# Patient Record
Sex: Female | Born: 1995 | Race: Black or African American | Hispanic: No | Marital: Single | Smoking: Never smoker
Health system: Southern US, Community
[De-identification: ages and names within clinical notes are randomized; demographics above are authoritative.]

## PROBLEM LIST (undated history)

## (undated) DIAGNOSIS — J302 Other seasonal allergic rhinitis: Secondary | ICD-10-CM

## (undated) HISTORY — PX: NO PAST SURGERIES: SHX2092

---
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
## 2017-01-11 ENCOUNTER — Emergency Department (HOSPITAL_COMMUNITY)
Admission: EM | Admit: 2017-01-11 | Discharge: 2017-01-11 | Disposition: A | Discharge status: Home / Self Care | Payer: Medicaid Other | Attending: Emergency Medicine | Admitting: Emergency Medicine

## 2017-01-11 DIAGNOSIS — H6123 Impacted cerumen, bilateral: Secondary | ICD-10-CM

## 2017-01-11 DIAGNOSIS — H9201 Otalgia, right ear: Secondary | ICD-10-CM | POA: Diagnosis present

## 2017-01-11 DIAGNOSIS — H6501 Acute serous otitis media, right ear: Secondary | ICD-10-CM

## 2017-01-11 MED ORDER — IBUPROFEN 400 MG PO TABS
600.0000 mg | ORAL_TABLET | Freq: Once | ORAL | Status: AC
  Administered 2017-01-11: 600 mg via ORAL
  Filled 2017-01-11: qty 1

## 2017-01-11 MED ORDER — AMOXICILLIN-POT CLAVULANATE 875-125 MG PO TABS: 1.0000 | ORAL_TABLET | Freq: Two times a day (BID) | ORAL | 0 refills | Status: DC

## 2017-01-11 MED ORDER — CARBAMIDE PEROXIDE 6.5 % OT SOLN: 5.0000 [drp] | Freq: Two times a day (BID) | OTIC | 0 refills | Status: DC

## 2017-01-11 NOTE — Discharge Instructions (Signed)
Take augmentin twice daily for a week.   Use debrox twice daily.   You have right ear infection. See ENT or your doctor next week for repeat ear exam   Return to ER if you have worse ear pain, fever, purulent discharge from the ear.

## 2017-01-11 NOTE — ED Provider Notes (Signed)
[facility] Provider Note   CSN: [id number] Arrival date & time: 01/11/17  2055     History   Chief Complaint Chief Complaint  Patient presents with  . Otalgia    HPI Angela Esparza is a 21 y.o. female history of seasonal allergies, previous ear infection who presents with bilateral ear congestion and right ear pain. She states that for the last several days, she felt that both her ears were "clogged". Her right ear started hurting today. She states that she has previous ear infection many years ago. Denies drainage from the ear, denies fevers.   The history is provided by the patient.    Past Medical History:  Diagnosis Date  . Seasonal allergies     There are no active problems to display for this patient.   Past Surgical History:  Procedure Laterality Date  . NO PAST SURGERIES      OB History    No data available       Home Medications    Prior to Admission medications   Medication Sig Start Date End Date Taking? Authorizing Provider  cetirizine (ZYRTEC) 10 MG tablet Take 10 mg by mouth daily.    [provider]  phenazopyridine (PYRIDIUM) 200 MG tablet Take 1 tablet (200 mg total) by mouth 3 (three) times daily. 05/08/15   Judeth Horn, NP    Family History No family history on file.  Social History Social History  Substance Use Topics  . Smoking status: Never Smoker  . Smokeless tobacco: Never Used  . Alcohol use No     Allergies   Patient has no known allergies.   Review of Systems Review of Systems  HENT: Positive for ear pain.   All other systems reviewed and are negative.    Physical Exam Updated Vital Signs BP 138/81 (BP Location: Right Arm)   Pulse 94   Temp 98.7 F (37.1 C) (Oral)   Resp 16   Ht 5' 1.75" (1.568 m)   Wt 57.6 kg (127 lb)   LMP 12/21/2016 (Approximate)   SpO2 100%   BMI 23.42 kg/m   Physical Exam  Constitutional: She is oriented to person, place, and time. She appears  well-developed.  HENT:  Head: Normocephalic.  Mouth/Throat: Oropharynx is clear and moist.  Bilateral cerumen impaction   Eyes: Conjunctivae and EOM are normal. Pupils are equal, round, and reactive to light.  Neck: Normal range of motion. Neck supple.  Cardiovascular: Normal rate, regular rhythm and normal heart sounds.   Pulmonary/Chest: Effort normal and breath sounds normal.  Abdominal: Soft. Bowel sounds are normal.  Musculoskeletal: Normal range of motion.  Neurological: She is alert and oriented to person, place, and time. No cranial nerve deficit. Coordination normal.  Skin: Skin is warm.  Psychiatric: She has a normal mood and affect.  Nursing note and vitals reviewed.    ED Treatments / Results  Labs (all labs ordered are listed, but only abnormal results are displayed) Labs Reviewed - No data to display  EKG  EKG Interpretation None       Radiology No results found.  Procedures Procedures (including critical care time)  Ceruminosis is noted.  Wax is removed by syringing and manual debridement with currette. Instructions for home care to prevent wax buildup are given.   Medications Ordered in ED Medications  ibuprofen (ADVIL,MOTRIN) tablet 600 mg (600 mg Oral Given 01/11/17 2119)     Initial Impression / Assessment and Plan / ED Course  I have reviewed  the triage vital signs and the nursing notes.  Pertinent labs & imaging results that were available during my care of the patient were reviewed by me and considered in my medical decision making (see chart for details).     Angela Esparza is a 21 y.o. female here with bilateral cerumen impaction. Unable to visualize TM bilaterally, no signs of otitis externa. Will perform cerumen impaction.   10:02 PM I irrigated the ear and used curette as well. L TM nl. R TM with otitis media and TM retracted. I still have trouble seeing the whole TM and unsure if there is any perforation. Will give augmentin, try  debrox. Will have her follow up with ENT.   Final Clinical Impressions(s) / ED Diagnoses   Final diagnoses:  None    New Prescriptions New Prescriptions   No medications on file     Charlynne PanderYao, Devaeh Amadi Hsienta, MD 01/11/17 2204

## 2017-01-11 NOTE — ED Notes (Signed)
Patient denies pain and is resting comfortably.  

## 2017-01-11 NOTE — ED Triage Notes (Signed)
Reports pain in right ear that feels like an infection.  Also reports that both ears are "clogged".

## 2017-04-03 IMAGING — DX DG LUMBAR SPINE 2-3V
3 series · 3 of 3 positions shown · non-contrast
Comparison: None.

CLINICAL DATA: Lower right-sided back pain that radiates to the
right side of torso. Pain for 4 days. No injury.

EXAM:
LUMBAR SPINE - 2-3 VIEW

[l-spine ap]
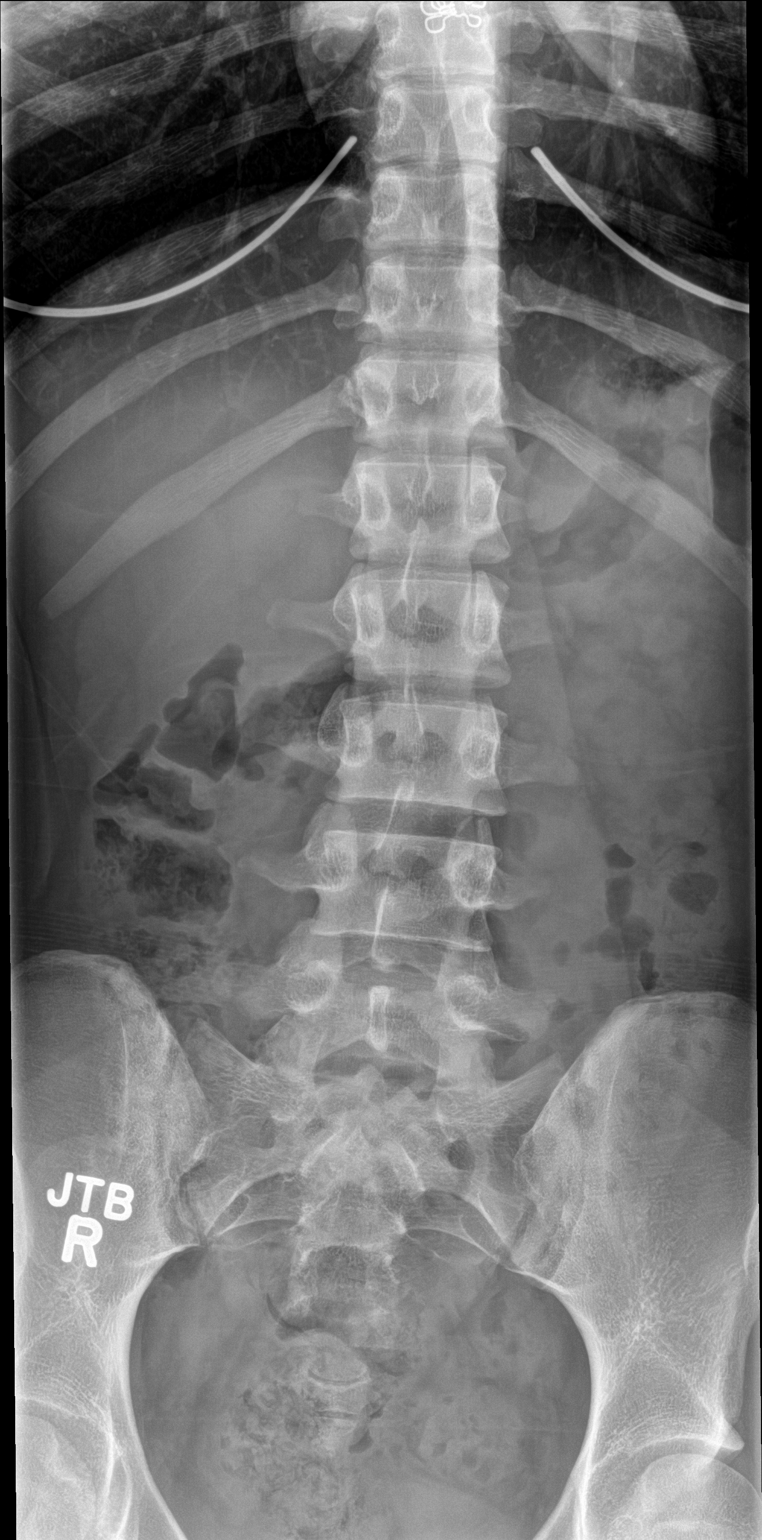

[l-spine lat]
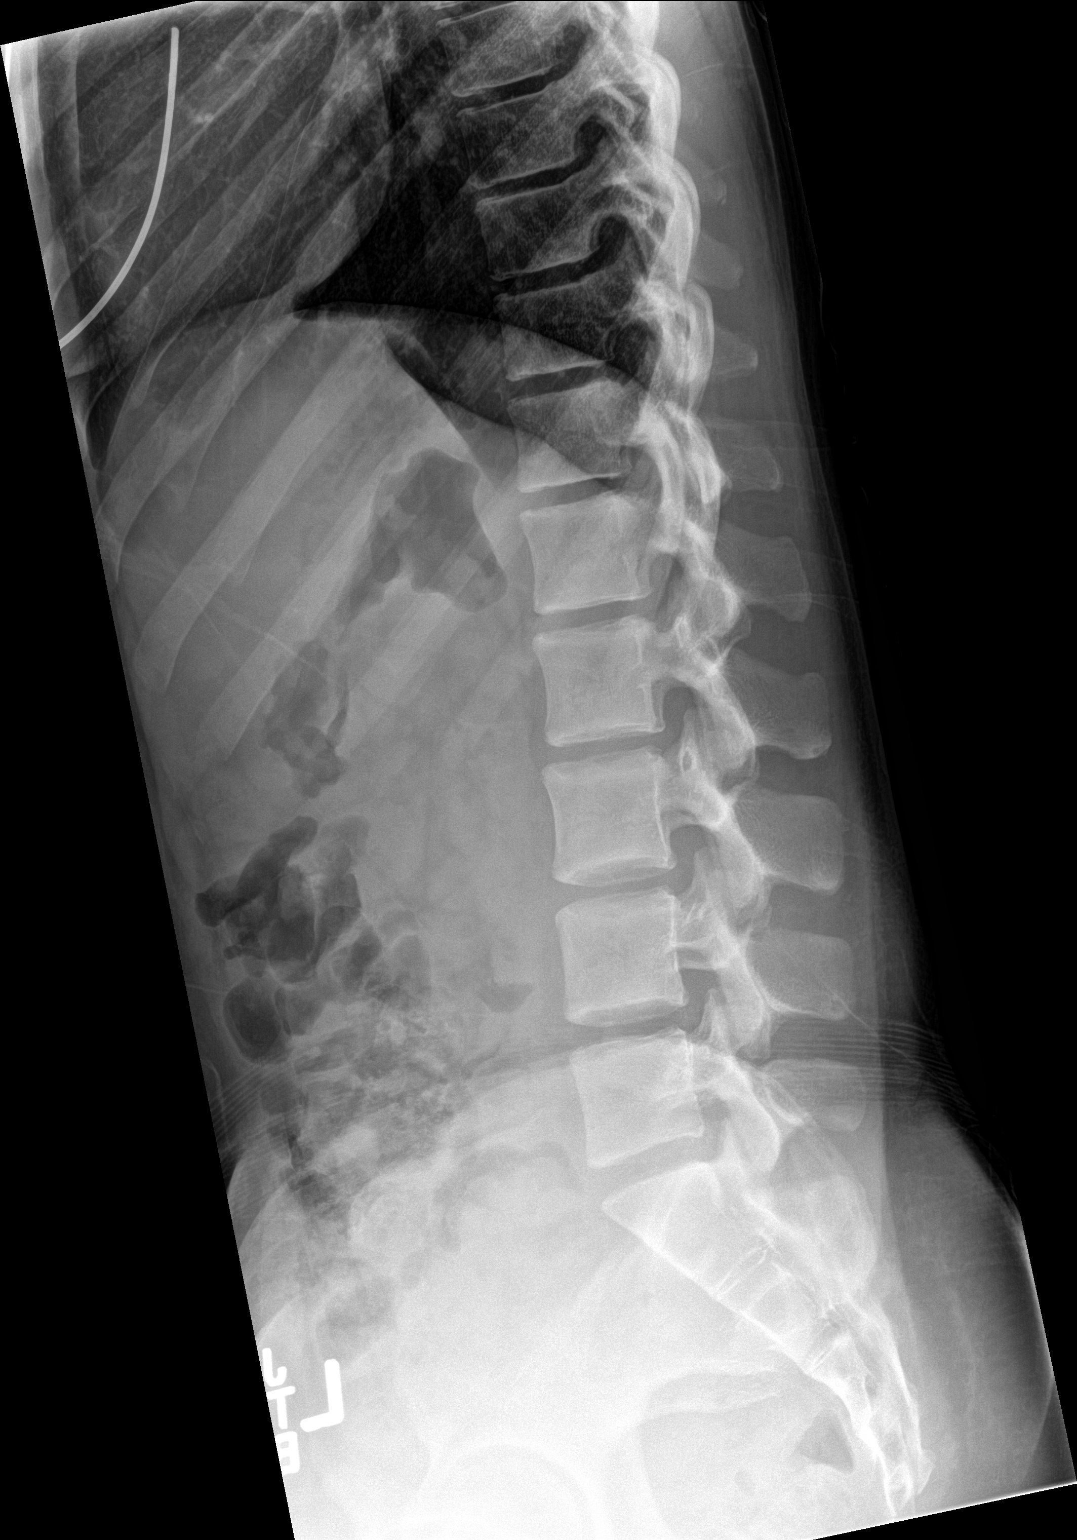

[l-spine spot]
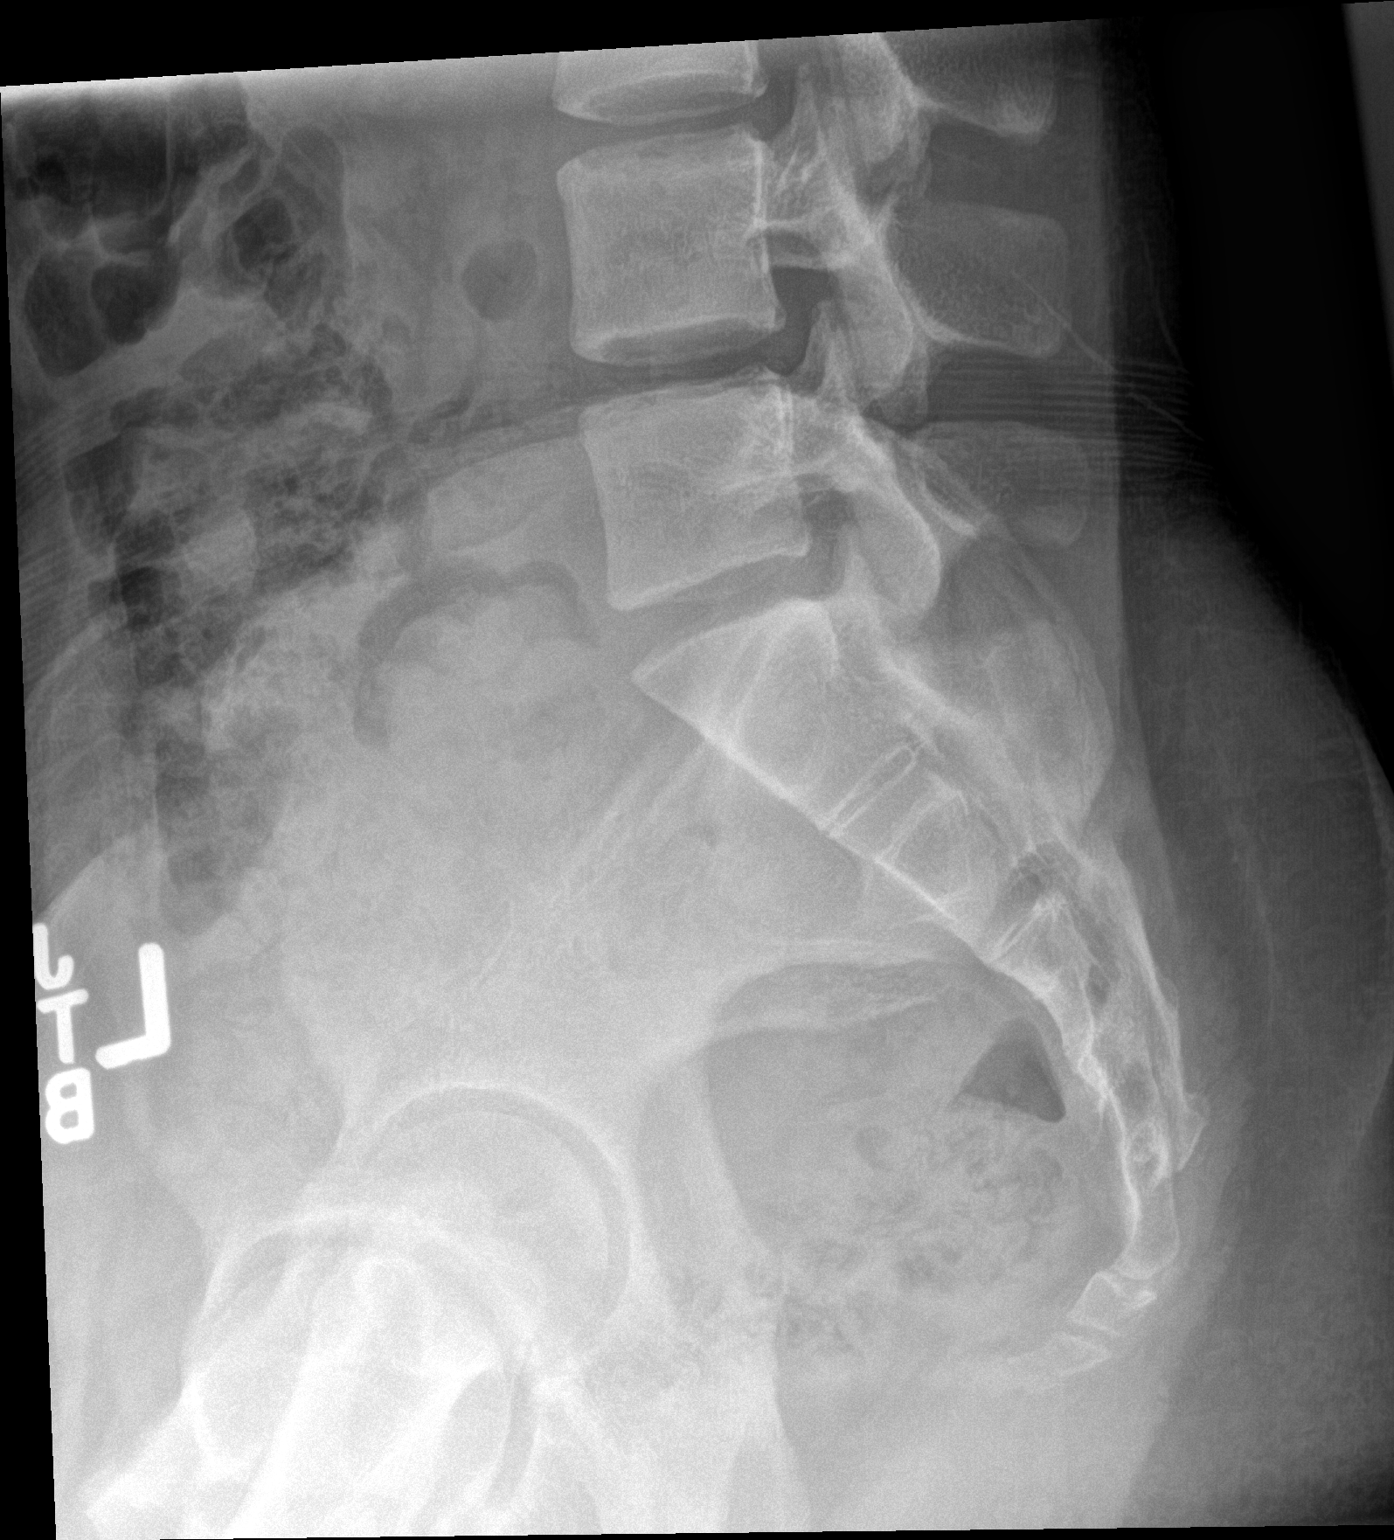

[3 of 3 positions shown; findings below may reference images not displayed]

FINDINGS: There is straightening of the normal lumbar lordosis. Alignment is
otherwise anatomic. Vertebral body and disc space height are
maintained.
IMPRESSION: Straightening of the normal cervical lordosis.  Otherwise negative.

## 2019-03-05 ENCOUNTER — Other Ambulatory Visit: Payer: Self-pay

## 2019-03-05 ENCOUNTER — Ambulatory Visit
Admission: EM | Admit: 2019-03-05 | Discharge: 2019-03-05 | Disposition: A | Discharge status: Home / Self Care | Payer: Self-pay

## 2019-03-05 ENCOUNTER — Encounter: Payer: Self-pay | Admitting: Emergency Medicine

## 2019-03-05 DIAGNOSIS — N898 Other specified noninflammatory disorders of vagina: Secondary | ICD-10-CM | POA: Insufficient documentation

## 2019-03-05 DIAGNOSIS — N3001 Acute cystitis with hematuria: Secondary | ICD-10-CM | POA: Insufficient documentation

## 2019-03-05 DIAGNOSIS — H6123 Impacted cerumen, bilateral: Secondary | ICD-10-CM | POA: Insufficient documentation

## 2019-03-05 DIAGNOSIS — B9689 Other specified bacterial agents as the cause of diseases classified elsewhere: Secondary | ICD-10-CM

## 2019-03-05 LAB — POCT URINALYSIS DIP (MANUAL ENTRY)
Bilirubin, UA: NEGATIVE
Glucose, UA: NEGATIVE mg/dL
Ketones, POC UA: NEGATIVE mg/dL
Nitrite, UA: NEGATIVE
Protein Ur, POC: NEGATIVE mg/dL
Spec Grav, UA: >=1.03 — AB (ref 1.010–1.025)
Urobilinogen, UA: 0.2 E.U./dL
pH, UA: 6 (ref 5.0–8.0)

## 2019-03-05 LAB — POCT URINE PREGNANCY: Preg Test, Ur: NEGATIVE

## 2019-03-05 MED ORDER — CEPHALEXIN 500 MG PO CAPS: 500.0000 mg | ORAL_CAPSULE | Freq: Two times a day (BID) | ORAL | 0 refills | Status: AC

## 2019-03-05 NOTE — ED Triage Notes (Signed)
Patient presents to [facility] for assessment of vaginal discharge x 2 weeks, denies pain during intercourse.  Patient is sexually active, does use oral contraceptives for [employer], and has a hx of BV.  Also c/o urinary frequency, burning with urination and a malodor starting 2 days ago.  Denies lower abdominal pain or back pain.

## 2019-03-05 NOTE — Discharge Instructions (Signed)
Take antibiotic as prescribed. We will call you with the results, and if positive treat appropriately. Recommend you use Debrox in your ears daily to soften cerumen and return for irrigation. ENT information also provided per your request.

## 2019-03-05 NOTE — ED Provider Notes (Signed)
[facility]    CSN: [id number] Arrival date & time: 03/05/19  1500     History   Chief Complaint Chief Complaint  Patient presents with  . Dysuria    HPI Angela Esparza is a 23 y.o. female with a remote history of BV presenting for 2-week course of vaginal discharge.  Patient states that her discharge does not seem quite like her previous episodes of BV as there is no malodor to it.  Patient denies vaginal, pelvic pain or irritation, dyspareunia, abdominal back pain.  Patient currently sexually active, not using protection.  LMP 8/1.  Patient also noting 2 days of urinary frequency, burning with urination, malodorous urine.  Patient has not anything for symptoms. Of note, patient wondering if her ears can get checked.  Patient states she has a chronic history of bilateral cerumen impaction: Has been to the ER for this previously, "they tried irrigating it but it was too hard so can come out ".  Patient has been using Debrox intermittently without significant relief.  Patient does not currently have insurance at this time, would like specialty evaluation, though does not want to have to pay the upfront cost.  Past Medical History:  Diagnosis Date  . Seasonal allergies     There are no active problems to display for this patient.   Past Surgical History:  Procedure Laterality Date  . NO PAST SURGERIES      OB History   No obstetric history on file.      Home Medications    Prior to Admission medications   Medication Sig Start Date End Date Taking? Authorizing Provider  tretinoin (RETIN-A) 0.025 % cream Apply topically at bedtime.   Yes [provider]  cephALEXin (KEFLEX) 500 MG capsule Take 1 capsule (500 mg total) by mouth 2 (two) times daily for 3 days. 03/05/19 03/08/19  Hall-Potvin, [place], PA-C  cetirizine (ZYRTEC) 10 MG tablet Take 10 mg by mouth daily.    [provider]    Family History History reviewed. No pertinent family  history.  Social History Social History   Tobacco Use  . Smoking status: Never Smoker  . Smokeless tobacco: Never Used  Substance Use Topics  . Alcohol use: No  . Drug use: Yes    Types: Marijuana    Comment: Last use of marijuana was June 2016     Allergies   Patient has no known allergies.   Review of Systems Review of Systems  Constitutional: Negative for fatigue and fever.  HENT: Negative for ear discharge, ear pain and hearing loss.   Respiratory: Negative for cough and shortness of breath.   Cardiovascular: Negative for chest pain and palpitations.  Gastrointestinal: Negative for constipation and diarrhea.  Genitourinary: Positive for dysuria, frequency, urgency and vaginal discharge. Negative for flank pain, hematuria, pelvic pain, vaginal bleeding and vaginal pain.     Physical Exam Triage Vital Signs ED Triage Vitals  Enc Vitals Group     BP 03/05/19 1517 130/90     Pulse Rate 03/05/19 1517 76     Resp 03/05/19 1517 18     Temp 03/05/19 1517 99.2 F (37.3 C)     Temp Source 03/05/19 1517 Oral     SpO2 03/05/19 1517 99 %     Weight --      Height --      Head Circumference --      Peak Flow --      Pain Score 03/05/19 1515 1  Pain Loc --      Pain Edu? --      Excl. in GC? --    No data found.  Updated Vital Signs BP 130/90 (BP Location: Left Arm)   Pulse 76   Temp 99.2 F (37.3 C) (Oral)   Resp 18   LMP 02/23/2019   SpO2 99%   Visual Acuity Right Eye Distance:   Left Eye Distance:   Bilateral Distance:    Right Eye Near:   Left Eye Near:    Bilateral Near:     Physical Exam Constitutional:      General: She is not in acute distress. HENT:     Head: Normocephalic and atraumatic.     Right Ear: External ear normal. There is impacted cerumen.     Left Ear: External ear normal. There is impacted cerumen.  Eyes:     General: No scleral icterus.    Pupils: Pupils are equal, round, and reactive to light.  Cardiovascular:     Rate  and Rhythm: Normal rate.  Pulmonary:     Effort: Pulmonary effort is normal.  Abdominal:     General: Bowel sounds are normal.     Palpations: Abdomen is soft.     Tenderness: There is no abdominal tenderness. There is no right CVA tenderness, left CVA tenderness or guarding.  Genitourinary:    Comments: Patient declined, self-swab performed Skin:    Coloration: Skin is not jaundiced or pale.  Neurological:     Mental Status: She is alert and oriented to person, place, and time.      UC Treatments / Results  Labs (all labs ordered are listed, but only abnormal results are displayed) Labs Reviewed  POCT URINALYSIS DIP (MANUAL ENTRY) - Abnormal; Notable for the following components:      Result Value   Spec Grav, UA >=1.030 (*)    Blood, UA trace-lysed (*)    Leukocytes, UA Small (1+) (*)    All other components within normal limits  POCT URINE PREGNANCY - Normal  URINE CULTURE  CERVICOVAGINAL ANCILLARY ONLY    EKG   Radiology No results found.  Procedures Procedures (including critical care time)  Medications Ordered in UC Medications - No data to display  Initial Impression / Assessment and Plan / UC Course  I have reviewed the triage vital signs and the nursing notes.  Pertinent labs & imaging results that were available during my care of the patient were reviewed by me and considered in my medical decision making (see chart for details).     1.  Acute cystitis with hematuria In office urine pregnancy test and dipstick done, reviewed by me: Negative pregnancy test, small leukocytes with trace, lysed RBCs: We will treat with Keflex.  2.  Vaginal discharge STI panel pending, will treat if indicated.  Prioritizing treatment of infectious UTI over history of BV to avoid adverse reactions to multiple antibiotic use.  Patient is part of decision making regarding this, agreeable to plan.  3.  Bilateral impacted cerumen Patient declined cerumen disimpaction attempt  in office today via irrigation, curette.  Patient to trial Debrox at home to help soften cerumen and seek reevaluation here for irrigation at a later date.  ENT contact information provided should patient change her mind regarding specialty care.  Final Clinical Impressions(s) / UC Diagnoses   Final diagnoses:  Vaginal discharge  Acute cystitis with hematuria  Bilateral impacted cerumen     Discharge Instructions  Take antibiotic as prescribed. We will call you with the results, and if positive treat appropriately. Recommend you use Debrox in your ears daily to soften cerumen and return for irrigation. ENT information also provided per your request.    ED Prescriptions    Medication Sig Dispense Auth. Provider   cephALEXin (KEFLEX) 500 MG capsule Take 1 capsule (500 mg total) by mouth 2 (two) times daily for 3 days. 6 capsule Hall-Potvin, [place], PA-C     Controlled Substance Prescriptions [place] Controlled Substance Registry consulted? Not Applicable   Shea EvansHall-Potvin, Brittany, Cordelia Poche-C 03/05/19 2017

## 2019-03-08 ENCOUNTER — Telehealth (HOSPITAL_COMMUNITY): Payer: Self-pay | Admitting: Emergency Medicine

## 2019-03-08 LAB — CERVICOVAGINAL ANCILLARY ONLY
Bacterial vaginitis: NEGATIVE
Chlamydia: NEGATIVE
Neisseria Gonorrhea: NEGATIVE
Trichomonas: NEGATIVE

## 2019-03-08 LAB — URINE CULTURE: Culture: 80000 — AB

## 2019-03-08 NOTE — Telephone Encounter (Signed)
Treated with keflex. Swab negative. Attempted to reach patient. No answer at this time.

## 2019-04-18 ENCOUNTER — Encounter (HOSPITAL_COMMUNITY): Payer: Self-pay | Admitting: Emergency Medicine

## 2019-04-18 ENCOUNTER — Ambulatory Visit (HOSPITAL_COMMUNITY)
Admission: EM | Admit: 2019-04-18 | Discharge: 2019-04-18 | Disposition: A | Discharge status: Home / Self Care | Payer: Medicaid Other | Attending: Emergency Medicine | Admitting: Emergency Medicine

## 2019-04-18 ENCOUNTER — Other Ambulatory Visit: Payer: Self-pay

## 2019-04-18 DIAGNOSIS — N760 Acute vaginitis: Secondary | ICD-10-CM | POA: Insufficient documentation

## 2019-04-18 DIAGNOSIS — B9689 Other specified bacterial agents as the cause of diseases classified elsewhere: Secondary | ICD-10-CM | POA: Insufficient documentation

## 2019-04-18 MED ORDER — METRONIDAZOLE 500 MG PO TABS: 500.0000 mg | ORAL_TABLET | Freq: Two times a day (BID) | ORAL | 0 refills | Status: AC

## 2019-04-18 NOTE — ED Triage Notes (Signed)
Pt c/o vaginal discharge, odor, for the past two weeks. staets she has hx of BV and this feels like it.

## 2019-04-18 NOTE — ED Provider Notes (Signed)
HPI  SUBJECTIVE:  Angela Esparza is a 23 y.o. female who presents with 2 weeks of thick, dark white odorous vaginal discharge.  No vomiting, back, abdominal, pelvic pain.  No urinary complaints.  No genital rash, labial swelling, itching.  No recent antibiotics, perfumed soaps or body washes.  She is in a long-term monogamous relationship with a female who is asymptomatic.  STDs are not a concern today.  She states this feels like BV which she gets frequently.  No aggravating or alleviating factors.  She has not tried anything for this.  She has remote history of chlamydia, yeast infection, recurrent BV.  No history of gonorrhea, HIV, HSV, syphilis, trichomonas, diabetes.  LMP:  2 to 3 weeks ago. Is on OCP's. Denies the possibility of being pregnant.  PMD: Health department.    Past Medical History:  Diagnosis Date  . Seasonal allergies     Past Surgical History:  Procedure Laterality Date  . NO PAST SURGERIES      No family history on file.  Social History   Tobacco Use  . Smoking status: Never Smoker  . Smokeless tobacco: Never Used  Substance Use Topics  . Alcohol use: No  . Drug use: Yes    Types: Marijuana    Comment: Last use of marijuana was June 2016    No current facility-administered medications for this encounter.   Current Outpatient Medications:  .  PRESCRIPTION MEDICATION, Some kind of birth control, pt does not remember the name., Disp: , Rfl:  .  cetirizine (ZYRTEC) 10 MG tablet, Take 10 mg by mouth daily., Disp: , Rfl:  .  metroNIDAZOLE (FLAGYL) 500 MG tablet, Take 1 tablet (500 mg total) by mouth 2 (two) times daily for 7 days., Disp: 14 tablet, Rfl: 0 .  tretinoin (RETIN-A) 0.025 % cream, Apply topically at bedtime., Disp: , Rfl:   No Known Allergies   ROS  As noted in HPI.   Physical Exam  BP 133/80   Pulse 81   Temp 97.9 F (36.6 C)   Resp 18   SpO2 100%   Constitutional: Well developed, well nourished, no acute distress Eyes:  EOMI,  conjunctiva normal bilaterally HENT: Normocephalic, atraumatic,mucus membranes moist Respiratory: Normal inspiratory effort Cardiovascular: Normal rate GI: nondistended soft.  No suprapubic, flank tenderness. Back: No CVAT GU: Deferred skin: No rash, skin intact Musculoskeletal: no deformities Neurologic: Alert & oriented x 3, no focal neuro deficits Psychiatric: Speech and behavior appropriate   ED Course   Medications - No data to display  No orders of the defined types were placed in this encounter.   No results found for this or any previous visit (from the past 24 hour(s)). No results found.  ED Clinical Impression  1. BV (bacterial vaginosis)      ED Assessment/Plan  History consistent with recurrent BV.  Will send home with 1 week of Flagyl.  Providing OB/GYN list for for routine care and for long-term treatment of her recurrent BV.  Sent off gonorrhea, chlamydia, trichomonas, yeast in addition to BV.  Discussed labs, imaging, MDM, treatment plan, and plan for follow-up with patient.  patient agrees with plan.   Meds ordered this encounter  Medications  . metroNIDAZOLE (FLAGYL) 500 MG tablet    Sig: Take 1 tablet (500 mg total) by mouth 2 (two) times daily for 7 days.    Dispense:  14 tablet    Refill:  0    *This clinic note was created using Dragon dictation  software. Therefore, there may be occasional mistakes despite careful proofreading.   ?    Melynda Ripple, MD 04/18/19 [phone]

## 2019-04-18 NOTE — Discharge Instructions (Addendum)
[place] [facility] Providers    [facility]       Phone: [phone]  [facility]   Phone: [phone]  [facility]  Phone: [phone]  [facility]  Phone: [phone]  [facility]  Phone: [phone]  [facility]   Phone: [phone]  [facility]       Phone: [phone]  [facility]    Phone: [phone]   [facility]    Phone: [phone]  [facility]    Phone: [phone]  [facility]    Phone: [phone]  [facility]       Phone: [phone]   [facility]  Phone: [phone]  [facility]    Phone: [phone]  [facility]   Phone: [phone]  [facility]      Phone: [phone]  [facility]    Phone: [phone]   

## 2019-04-19 LAB — CERVICOVAGINAL ANCILLARY ONLY
Bacterial Vaginitis (gardnerella): POSITIVE — AB
Candida Glabrata: NEGATIVE
Candida Vaginitis: NEGATIVE
Molecular Disclaimer: NEGATIVE
Molecular Disclaimer: NEGATIVE
Molecular Disclaimer: NEGATIVE
Molecular Disclaimer: NORMAL
Trichomonas: NEGATIVE

## 2019-04-20 LAB — CERVICOVAGINAL ANCILLARY ONLY
Chlamydia: NEGATIVE
Neisseria Gonorrhea: NEGATIVE

## 2022-08-24 ENCOUNTER — Encounter: Payer: Self-pay | Admitting: *Deleted

## 2022-08-29 ENCOUNTER — Telehealth: Payer: Self-pay | Admitting: Family Medicine

## 2022-08-29 ENCOUNTER — Ambulatory Visit (INDEPENDENT_AMBULATORY_CARE_PROVIDER_SITE_OTHER): Payer: Commercial Managed Care - HMO | Admitting: Obstetrics and Gynecology

## 2022-08-29 ENCOUNTER — Other Ambulatory Visit (HOSPITAL_COMMUNITY)
Admission: RE | Admit: 2022-08-29 | Discharge: 2022-08-29 | Disposition: A | Discharge status: Home / Self Care | Payer: Commercial Managed Care - HMO | Source: Ambulatory Visit | Attending: Obstetrics and Gynecology | Admitting: Obstetrics and Gynecology

## 2022-08-29 ENCOUNTER — Encounter: Payer: Self-pay | Admitting: Obstetrics and Gynecology

## 2022-08-29 ENCOUNTER — Other Ambulatory Visit: Payer: Self-pay

## 2022-08-29 VITALS — BP 115/71 | HR 87 | Wt 153.1 lb

## 2022-08-29 DIAGNOSIS — Z113 Encounter for screening for infections with a predominantly sexual mode of transmission: Secondary | ICD-10-CM | POA: Diagnosis present

## 2022-08-29 DIAGNOSIS — R87612 Low grade squamous intraepithelial lesion on cytologic smear of cervix (LGSIL): Secondary | ICD-10-CM | POA: Insufficient documentation

## 2022-08-29 DIAGNOSIS — N879 Dysplasia of cervix uteri, unspecified: Secondary | ICD-10-CM | POA: Insufficient documentation

## 2022-08-29 HISTORY — PX: COLPOSCOPY: PRO47

## 2022-08-29 LAB — POCT PREGNANCY, URINE: Preg Test, Ur: NEGATIVE

## 2022-08-29 NOTE — Telephone Encounter (Signed)
Has questions about HPV, would like a call back

## 2022-08-29 NOTE — Progress Notes (Signed)
Obstetrics and Gynecology New Patient Evaluation  Appointment Date: 08/29/2022  OBGYN Clinic: [facility]  Referring Provider: [facility]  Chief Complaint: Pap smear consult   History of Present Illness: Angela Esparza' Angela Esparza is a 27 y.o. African-American G0P, seen for the above chief complaint. Her past medical history is significant for LSIL pap  Pap History 04/05/2022: ASCUS/HPV (untyped) LSIL/HPV+ (untyped) 01/14/2020: LSIL/no HPV done  Patient states these are her only pap smears and no biopsies in the past  Review of Systems: A comprehensive review of systems was negative.   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 She is currently using oral contraceptives (estrogen/progesterone) for contraception.   Social History:  Social History   Socioeconomic History   Marital status: Single    Spouse name: Not on file   Number of children: Not on file   Years of education: Not on file   Highest education level: Not on file  Occupational History   Not on file  Tobacco Use   Smoking status: Never   Smokeless tobacco: Never  Substance and Sexual Activity   Alcohol use: No   Drug use: Yes    Types: Marijuana    Comment: Last use of marijuana was June 2016   Sexual activity: Never  Other Topics Concern   Not on file  Social History Narrative   Not on file   Social Determinants of Health   Financial Resource Strain: Not on file  Food Insecurity: No Food Insecurity (08/29/2022)   Hunger Vital Sign    Worried  About Running Out of Food in the Last Year: Never true    Ran Out of Food in the Last Year: Never true  Transportation Needs: No Transportation Needs (08/29/2022)   PRAPARE - Hydrologist (Medical): No    Lack of Transportation (Non-Medical): No  Physical Activity: Not on file  Stress: Not on file  Social Connections: Not on file  Intimate Partner Violence: Not on file    Family History: No family history on file.   Medications Angela Esparza had no medications administered during this visit. Current Outpatient Medications  Medication Sig Dispense Refill   norgestimate-ethinyl estradiol (ORTHO-CYCLEN) 0.25-35 MG-MCG tablet Take 1 tablet by mouth daily.     No current facility-administered medications for this visit.    Allergies Patient has no known allergies.   Physical Exam:  BP 115/71   Pulse 87   Wt 153 lb 1.6 oz (69.4 kg)   BMI 28.23 kg/m  Body mass index is 28.23 kg/m. General appearance: Well nourished, well developed female in no acute distress.  Respiratory:  Normal respiratory effort Abdomen:diffusely non tender to palpation, non distended Neuro/Psych:  Normal mood and affect.  Skin:  Warm and dry.  Lymphatic:  No inguinal lymphadenopathy.   Pelvic exam: is not limited by body habitus EGBUS: within normal limits Vagina: within normal limits and with no blood or  discharge in the vault Cervix: normal appearing cervix without tenderness, discharge or lesions. 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 Will let her know results but told her she will likely just need repeat pap in one year (Feb 2025)  No orders of the defined types were placed in this encounter.   RTC PRN  Durene Romans MD Attending Center for [facility])

## 2022-08-29 NOTE — Procedures (Signed)
Colposcopy Procedure Note  Pre-operative Diagnosis:  Pap History 04/05/2022: ASCUS/HPV (untyped) LSIL/HPV+ (untyped) 01/14/2020: LSIL/no HPV done  Post-operative Diagnosis: CIN 1  Procedure Details  Urine pregnancy test: negative.   The risks (including infection, bleeding, pain) and benefits of the procedure were explained to the patient and written informed consent was obtained.  The patient was placed in the dorsal lithotomy position. A Pederson was speculum inserted in the vagina, and the cervix was visualized.  Acetic acid staining was done and the cervix was viewed with green filter  Biopsy from 6 o'clock and then single toothed tenaculum applied and endocervical curettage in all four quadrants done. There was no bleeding after procedure after application of monsel's  Findings: diffuse AWE changes. Some friability at 6 o'clock  Adequate: yes  Specimens: 6 o'clock  Condition: Stable  Complications: None  Plan: Patient also denies any tobacco use or 2nd hand smoke exposure.  The patient was advised to call for any fever or for prolonged or severe pain or bleeding. She was advised to use OTC analgesics as needed for mild to moderate pain. Pelvic rest for one week   Durene Romans MD Attending Center for [facility])

## 2022-08-30 LAB — CERVICOVAGINAL ANCILLARY ONLY
Bacterial Vaginitis (gardnerella): NEGATIVE
Candida Glabrata: NEGATIVE
Candida Vaginitis: NEGATIVE
Chlamydia: NEGATIVE
Comment: NEGATIVE
Comment: NEGATIVE
Comment: NEGATIVE
Comment: NEGATIVE
Comment: NEGATIVE
Comment: NORMAL
Neisseria Gonorrhea: NEGATIVE
Trichomonas: NEGATIVE

## 2022-08-30 LAB — SURGICAL PATHOLOGY

## 2022-08-30 NOTE — Telephone Encounter (Signed)
Pt called requesting clarification on why a colposcopy was performed at her appointment yesterday (02/05) with Dr. Ilda Basset. RN explained that patient had history of 3 abnormal PAP smears which resulted in next step to be colposcopy. Pt also requesting results from vaginal swab. Results given. I explained that Dr. Ilda Basset would make his comments on the colpo once it resulted. Pt verbalized understanding and denied further questions.

## 2022-09-12 ENCOUNTER — Encounter: Payer: Self-pay | Admitting: Obstetrics and Gynecology

## 2023-12-22 ENCOUNTER — Ambulatory Visit: Payer: Self-pay

## 2024-02-01 ENCOUNTER — Inpatient Hospital Stay
Admission: RE | Admit: 2024-02-01 | Discharge: 2024-02-01 | Disposition: A | Discharge status: Home / Self Care | Payer: Self-pay | Source: Ambulatory Visit

## 2024-03-07 ENCOUNTER — Encounter: Payer: Self-pay | Admitting: Obstetrics & Gynecology

## 2024-04-11 ENCOUNTER — Ambulatory Visit: Payer: Self-pay | Admitting: *Deleted

## 2024-04-11 VITALS — BP 118/82 | Wt 145.0 lb

## 2024-04-11 DIAGNOSIS — Z1239 Encounter for other screening for malignant neoplasm of breast: Secondary | ICD-10-CM

## 2024-04-11 DIAGNOSIS — R87612 Low grade squamous intraepithelial lesion on cytologic smear of cervix (LGSIL): Secondary | ICD-10-CM

## 2024-04-11 NOTE — Progress Notes (Signed)
 Angela Esparza is a 28 y.o. female who presents to [facility] clinic today with no complaints. Patient referred to [facility] due to having and abnormal Pap smear 02/16/2024 that a colposcopy is recommended for follow up.   Pap Smear: Pap smear not completed today. Last Pap smear was 02/16/2024 at the [facility] and was abnormal - LSIL with positive HPV. 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 Last Pap smear result is available in Epic.   Physical exam: Breasts Breasts symmetrical. No skin abnormalities bilateral breasts. No nipple retraction bilateral breasts. No nipple discharge bilateral breasts. No lymphadenopathy. No lumps palpated bilateral breasts. No complaints of pain or tenderness on exam. Screening mammogram recommended at age 56 unless clinically indicated prior.      Pelvic/Bimanual Pap is not indicated today per [facility] guidelines.   Smoking History: Patient currently smokes Marijuana occasionally. Discussed smoking cessation. Referred to the [facility] Quitline.   Patient Navigation: Patient education provided. Access to services provided for patient through [facility] program.    Breast and Cervical Cancer Risk Assessment: Patient has family history of her maternal grandmother having breast cancer. Patient has no known genetic mutations or history of radiation treatment to the chest before age 30. Patient has history of cervical dysplasia. Patient has no history of being immunocompromised or DES exposure in-utero. Breast Cancer risk assessment completed. No breast cancer risk calculated due to patient is less than 64 years old.  Risk Assessment   No risk assessment data     A: [facility] exam without pap smear No complaints.  P: Referred patient to the [facility] for [facility] for a colposcopy to follow up for her abnormal Pap smear. Appointment  scheduled Thursday, April 18, 2024 at [phone].  Driscilla Wanda SQUIBB, RN 04/11/2024 9:35 AM

## 2024-04-11 NOTE — Patient Instructions (Addendum)
 Explained breast self awareness with Angela Esparza. Patient did not need a Pap smear today due to last Pap smear was 02/16/2024. Explained the colposcopy the recommended follow up for her abnormal Pap smear. Referred patient to the [facility] for [facility] for a colposcopy to follow up for her abnormal Pap smear. Appointment scheduled Thursday, April 18, 2024 at [phone]. Patient aware of appointment and will be there. Let patient know a screening mammogram is recommended at age 37 unless clinically indicated prior. Angela Esparza verbalized understanding.  Burhan Barham, Wanda Ship, RN 9:35 AM

## 2024-04-18 ENCOUNTER — Other Ambulatory Visit: Payer: Self-pay

## 2024-04-18 ENCOUNTER — Other Ambulatory Visit (HOSPITAL_COMMUNITY)
Admission: RE | Admit: 2024-04-18 | Discharge: 2024-04-18 | Disposition: A | Payer: Self-pay | Source: Ambulatory Visit | Attending: Obstetrics and Gynecology | Admitting: Obstetrics and Gynecology

## 2024-04-18 ENCOUNTER — Ambulatory Visit (INDEPENDENT_AMBULATORY_CARE_PROVIDER_SITE_OTHER): Payer: Self-pay | Admitting: Obstetrics and Gynecology

## 2024-04-18 ENCOUNTER — Encounter: Payer: Self-pay | Admitting: Obstetrics and Gynecology

## 2024-04-18 VITALS — BP 126/92 | HR 102 | Ht 61.7 in | Wt 142.4 lb

## 2024-04-18 DIAGNOSIS — Z1331 Encounter for screening for depression: Secondary | ICD-10-CM

## 2024-04-18 DIAGNOSIS — N870 Mild cervical dysplasia: Secondary | ICD-10-CM | POA: Insufficient documentation

## 2024-04-18 DIAGNOSIS — Z3202 Encounter for pregnancy test, result negative: Secondary | ICD-10-CM

## 2024-04-18 LAB — POCT PREGNANCY, URINE: Preg Test, Ur: NEGATIVE

## 2024-04-18 NOTE — Progress Notes (Signed)
    GYNECOLOGY CLINIC COLPOSCOPY PROCEDURE NOTE  28 y.o. G0P0000 here for colposcopy for pap finding of:  Result Date Procedure Results Follow-ups  02/16/2024 Cytology - PAP    08/29/2022 Surgical pathology( [place]/ [facility]) SURGICAL PATHOLOGY: SURGICAL PATHOLOGY CASE: [id number] PATIENT: Angela Esparza Surgical Pathology Report     Clinical History: CIN I, cervical dysplasia, screen for STD; history of ASCUS and LSIL (nt)     FINAL MICROSCOPIC DIAGNOSIS:  A. CERVIX, 6 O...     Discussed role for HPV in cervical dysplasia, need for surveillance, nature of the procedure, and risks and benefits.  Pregnancy test: Lab Results  Component Value Date   PREGTESTUR NEGATIVE 04/18/2024    No Known Allergies  Patient given informed consent, signed copy in the chart, time out was performed.    Placed in lithotomy position. Cervix viewed with speculum and colposcope after application of acetic acidand lugol's solution.   Colposcopy Adequacy Cervix fully visualized: Yes  SCJ fully visualized: Yes  Colposcopy Findings no visible lesions  Corresponding biopsies were not obtained.    ECC specimen was obtained.  All specimens were labeled and sent to pathology.  Hemostatic measures: None  Complications: none  Patient tolerated the procedure well.  OBGyn Exam  Colposcopy Impressions Low grade features  Plan Will contact patient and nursing staff by mychart  Patient was given post procedure instructions.  Will follow up pathology and manage accordingly; patient will be contacted with results and recommendations.  Routine preventative health maintenance measures emphasized.   Jerilynn DELENA Buddle, MD

## 2024-04-22 LAB — SURGICAL PATHOLOGY

## 2024-04-23 ENCOUNTER — Ambulatory Visit: Payer: Self-pay | Admitting: Obstetrics and Gynecology

## 2024-06-25 ENCOUNTER — Telehealth: Payer: Self-pay

## 2024-06-25 NOTE — Telephone Encounter (Signed)
 Patient left voicemail requesting to speak with someone regarding her balance from her colposcopy procedure.  [place], RN

## 2024-06-26 ENCOUNTER — Telehealth: Payer: Self-pay | Admitting: Family Medicine

## 2024-06-26 NOTE — Telephone Encounter (Signed)
 Called patient regarding VM that was left on RN line regarding questions about her visit on 04/18/24. Patient was aware of payment for pregnancy test but was not sure what the behav assmt was and why she was being charged for it. I placed her on hold and went to ask Pam, CMA who was the clinical staff for Dr. Zina that day and she explained that it was the depression screening that every patient is asked. I explained to patient everything and she was asking if it was a requirement. I answered that I was not sure and then she asked if she could speak to an print production planner. I told her that I would pass on her information to our operating manager Rolena Hurst) and she would get in contact with her once she came back into the office. Patient then thank me for returning her call and to have a great day.
